# Patient Record
Sex: Male | Born: 1985 | Race: White | Hispanic: No | Smoking: Never smoker
Health system: Southern US, Community
[De-identification: ages and names within clinical notes are randomized; demographics above are authoritative.]

---

## 2015-08-13 ENCOUNTER — Emergency Department (HOSPITAL_COMMUNITY)
Admission: EM | Admit: 2015-08-13 | Discharge: 2015-08-13 | Disposition: A | Discharge status: Home / Self Care | Payer: Self-pay | Attending: Emergency Medicine | Admitting: Emergency Medicine

## 2015-08-13 ENCOUNTER — Emergency Department (HOSPITAL_COMMUNITY): Payer: Self-pay

## 2015-08-13 ENCOUNTER — Encounter (HOSPITAL_COMMUNITY): Payer: Self-pay | Admitting: *Deleted

## 2015-08-13 DIAGNOSIS — Z880 Allergy status to penicillin: Secondary | ICD-10-CM | POA: Insufficient documentation

## 2015-08-13 DIAGNOSIS — R0682 Tachypnea, not elsewhere classified: Secondary | ICD-10-CM | POA: Insufficient documentation

## 2015-08-13 DIAGNOSIS — R509 Fever, unspecified: Secondary | ICD-10-CM | POA: Insufficient documentation

## 2015-08-13 DIAGNOSIS — R000 Tachycardia, unspecified: Secondary | ICD-10-CM | POA: Insufficient documentation

## 2015-08-13 DIAGNOSIS — R062 Wheezing: Secondary | ICD-10-CM | POA: Insufficient documentation

## 2015-08-13 MED ORDER — ALBUTEROL SULFATE (2.5 MG/3ML) 0.083% IN NEBU
5.0000 mg | INHALATION_SOLUTION | Freq: Once | RESPIRATORY_TRACT | Status: AC
  Administered 2015-08-13: 5 mg via RESPIRATORY_TRACT
  Filled 2015-08-13: qty 6

## 2015-08-13 MED ORDER — PREDNISONE 20 MG PO TABS
60.0000 mg | ORAL_TABLET | ORAL | Status: AC
  Administered 2015-08-13: 60 mg via ORAL
  Filled 2015-08-13: qty 3

## 2015-08-13 MED ORDER — ALBUTEROL SULFATE (2.5 MG/3ML) 0.083% IN NEBU
5.0000 mg | INHALATION_SOLUTION | Freq: Once | RESPIRATORY_TRACT | Status: AC
  Administered 2015-08-13: 5 mg via RESPIRATORY_TRACT

## 2015-08-13 MED ORDER — ALBUTEROL SULFATE HFA 108 (90 BASE) MCG/ACT IN AERS
2.0000 | INHALATION_SPRAY | Freq: Four times a day (QID) | RESPIRATORY_TRACT | Status: DC
  Filled 2015-08-13: qty 6.7

## 2015-08-13 MED ORDER — LEVOFLOXACIN 500 MG PO TABS: 500.0000 mg | ORAL_TABLET | Freq: Every day | ORAL | Status: AC

## 2015-08-13 MED ORDER — ALBUTEROL SULFATE (2.5 MG/3ML) 0.083% IN NEBU
INHALATION_SOLUTION | RESPIRATORY_TRACT | Status: AC
  Filled 2015-08-13: qty 6

## 2015-08-13 MED ORDER — PREDNISONE 20 MG PO TABS: 60.0000 mg | ORAL_TABLET | Freq: Every day | ORAL | Status: AC

## 2015-08-13 NOTE — ED Notes (Signed)
Pt reports having productive cough x 3 days, having green sputum, wheezing and fever. Airway intact at triage, spo2 94% at triage.
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
## 2015-08-13 NOTE — Discharge Instructions (Signed)
As discussed, it is important that you monitor your condition carefully.  Please use the provided albuterol inhaler every four hours for the next two days.  Please fill the provider antibiotic prescription if your condition worsens, and still be sure to follow up with a primary care physician; either at the [facility], or at our affiliated center.

## 2016-09-19 IMAGING — CR DG CHEST 2V
2 series · 2 of 2 positions shown · non-contrast
Comparison: None.

CLINICAL DATA: Cough

EXAM:
CHEST  2 VIEW

[chest pa]
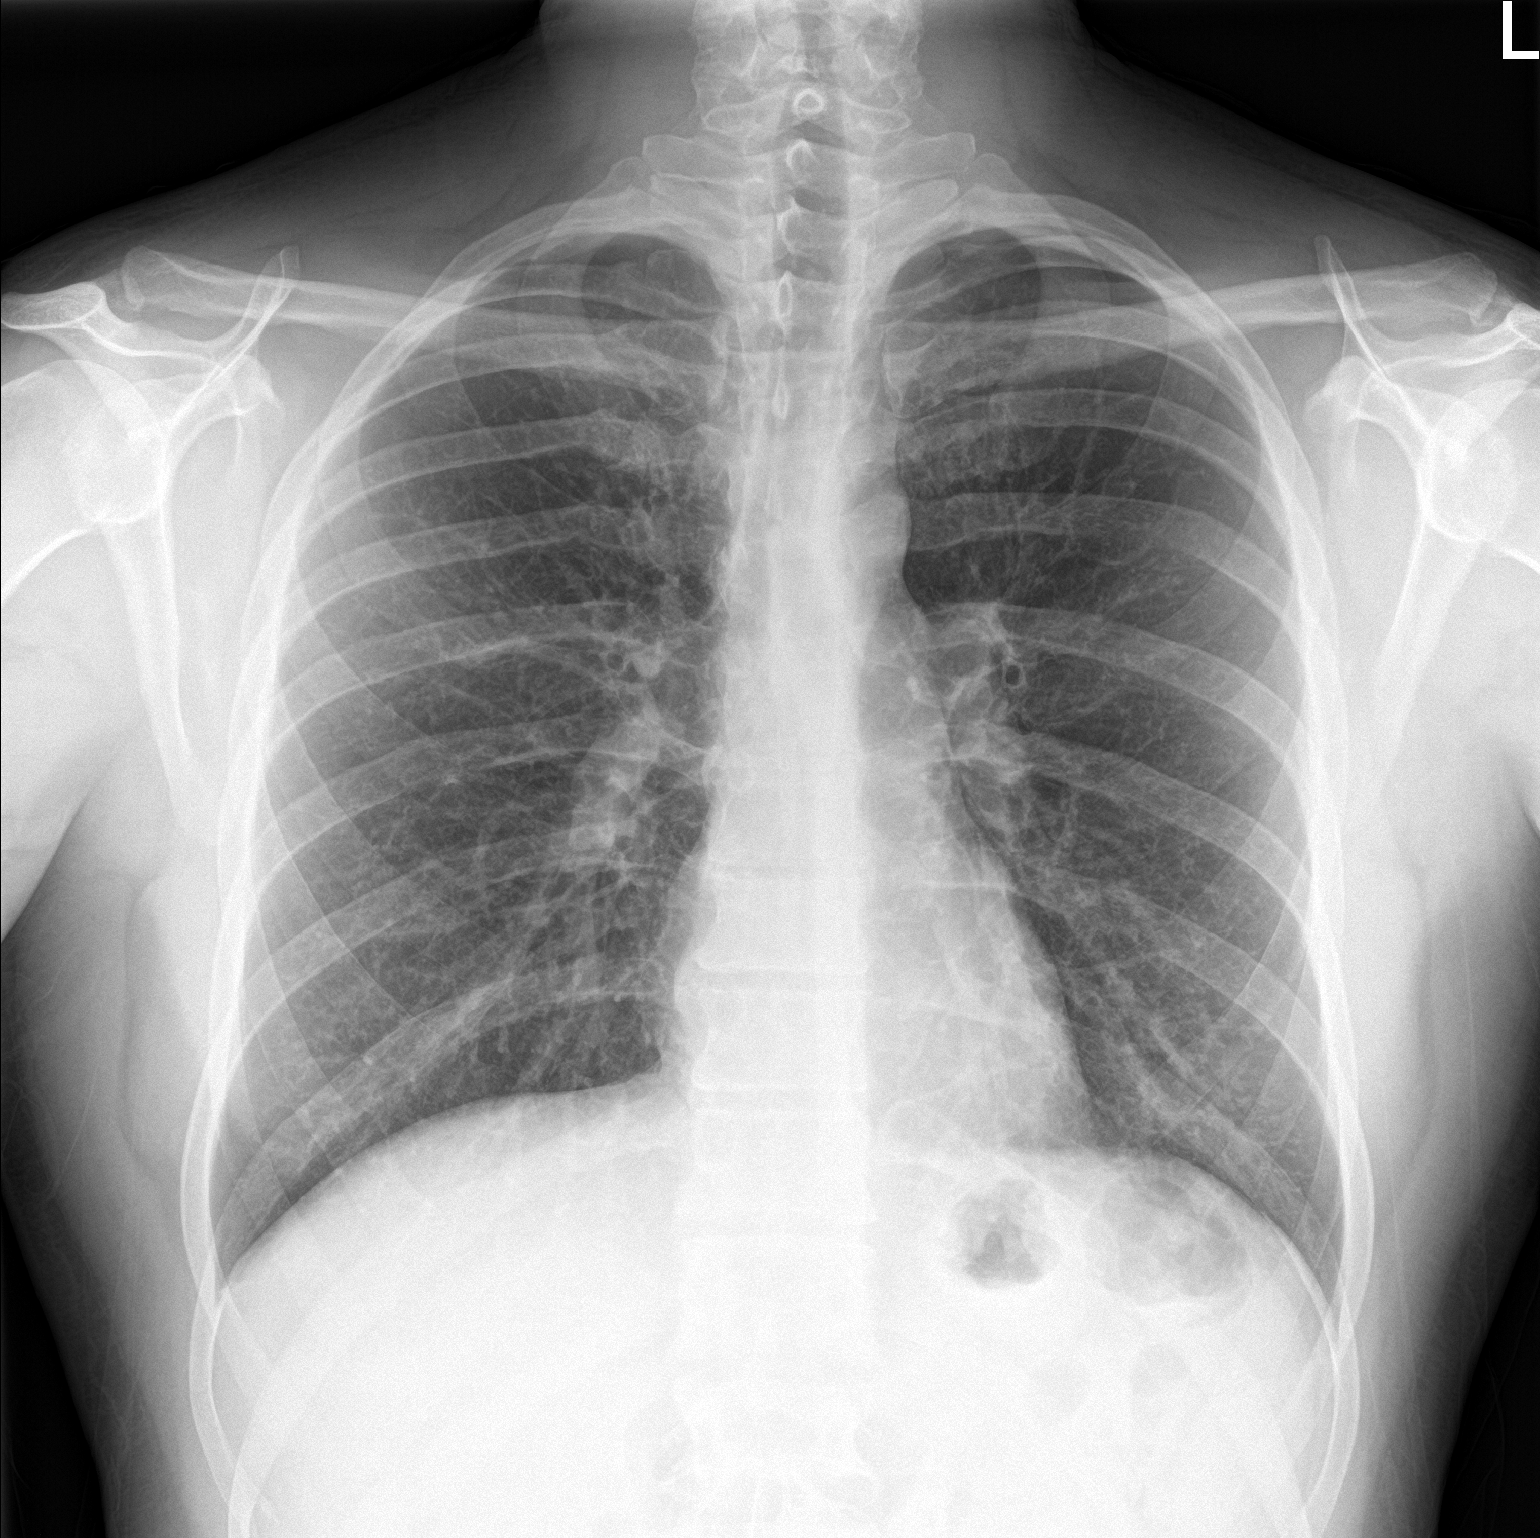

[chest lat]
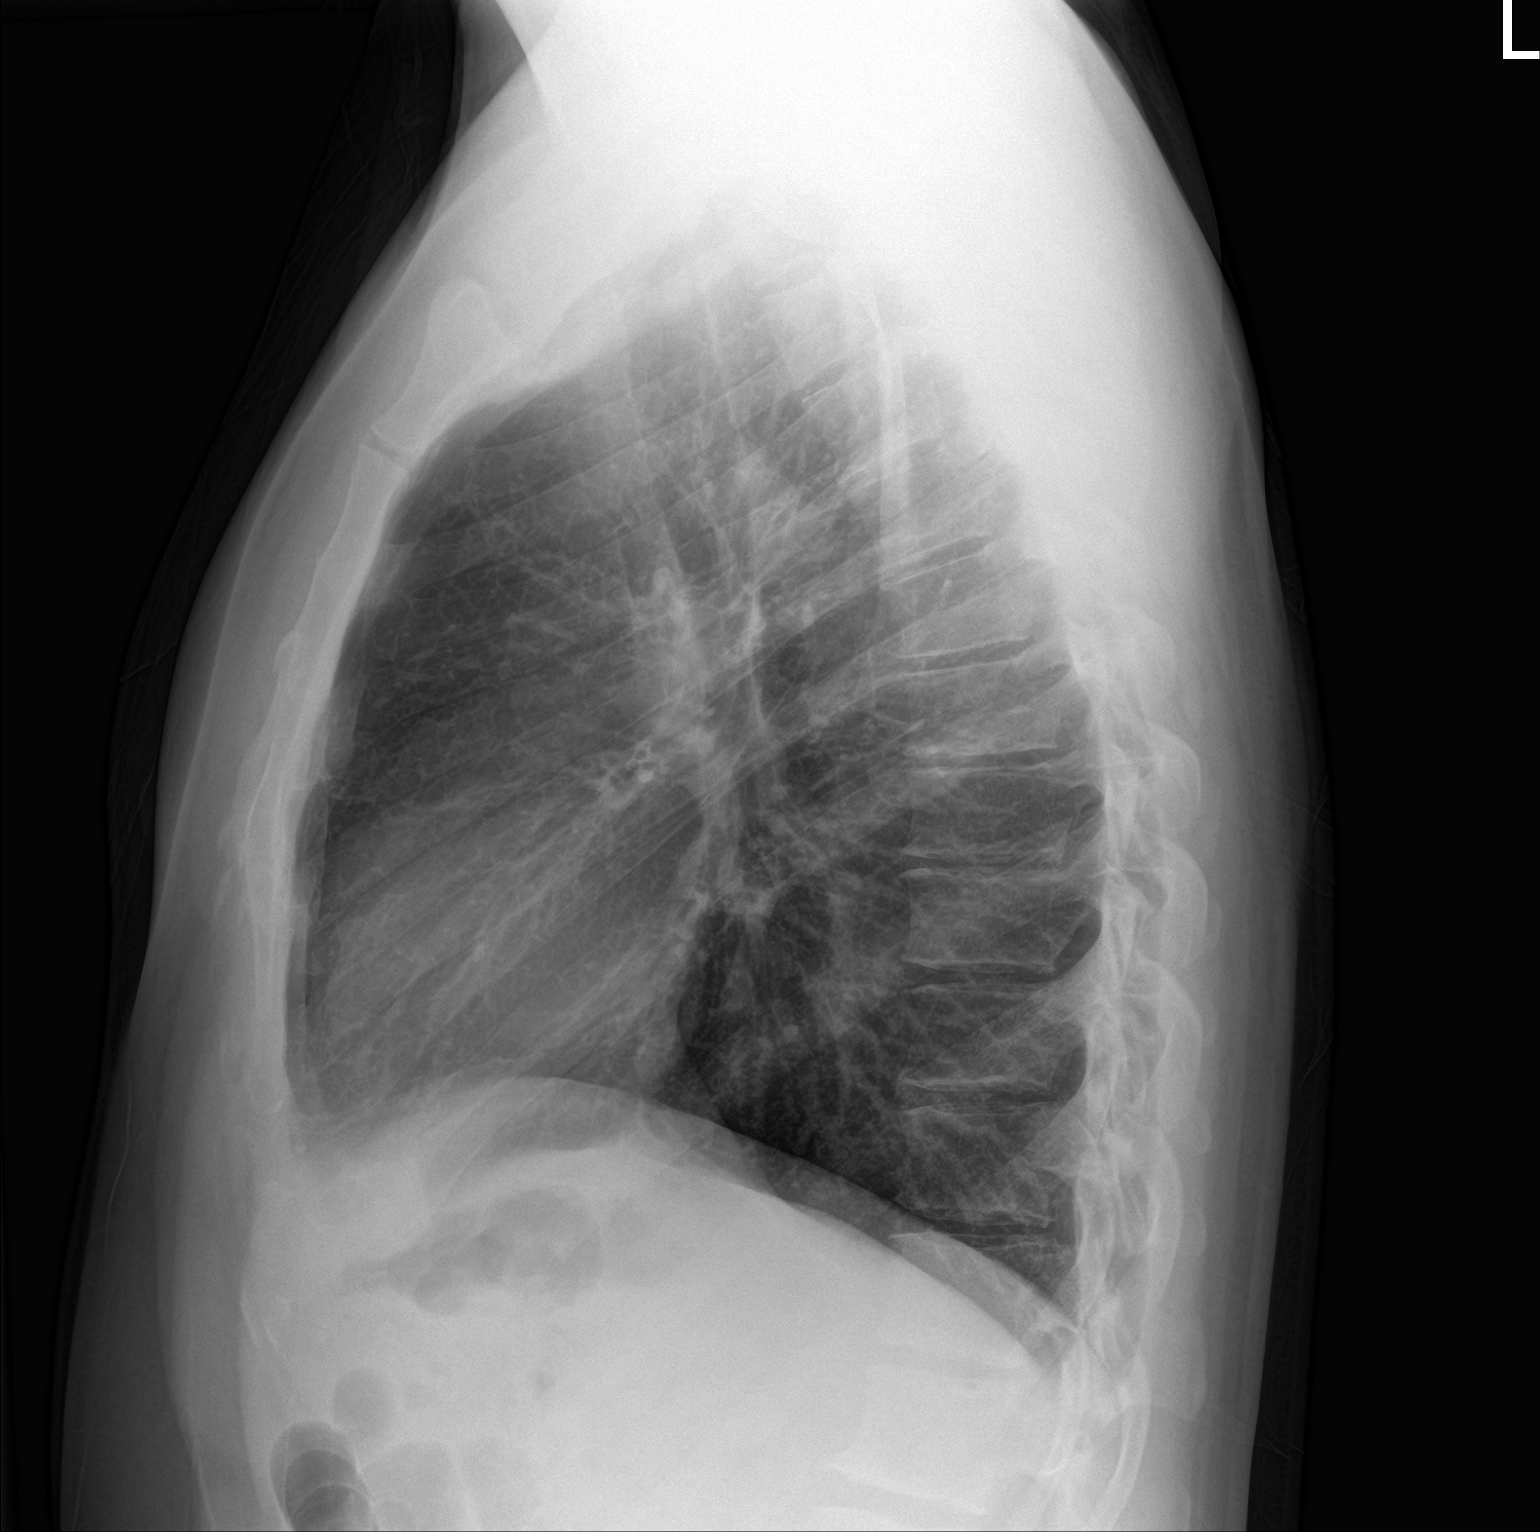

[2 of 2 positions shown; findings below may reference images not displayed]

FINDINGS: Normal heart size. Normal mediastinal contour. No pneumothorax. No
pleural effusion. Clear lungs, with no focal lung consolidation and
no pulmonary edema. Mild degenerative changes in the thoracic spine.
IMPRESSION: No active disease in the chest.
